# Patient Record
Sex: Female | Born: 1990 | Race: White | Hispanic: No | Marital: Married | Smoking: Never smoker
Health system: Southern US, Community
[De-identification: ages and names within clinical notes are randomized; demographics above are authoritative.]

## PROBLEM LIST (undated history)

## (undated) HISTORY — PX: NO PAST SURGERIES: SHX2092

---
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
## 2012-09-22 ENCOUNTER — Ambulatory Visit: Payer: Self-pay | Admitting: Emergency Medicine

## 2020-01-27 ENCOUNTER — Ambulatory Visit (INDEPENDENT_AMBULATORY_CARE_PROVIDER_SITE_OTHER)
Admit: 2020-01-27 | Discharge: 2020-01-27 | Disposition: A | Discharge status: Home / Self Care | Payer: BC Managed Care – PPO | Attending: Family Medicine | Admitting: Family Medicine

## 2020-01-27 ENCOUNTER — Ambulatory Visit
Admission: EM | Admit: 2020-01-27 | Discharge: 2020-01-27 | Disposition: A | Discharge status: Home / Self Care | Payer: BC Managed Care – PPO | Attending: Family Medicine | Admitting: Family Medicine

## 2020-01-27 ENCOUNTER — Encounter: Payer: Self-pay | Admitting: Emergency Medicine

## 2020-01-27 ENCOUNTER — Other Ambulatory Visit: Payer: Self-pay

## 2020-01-27 DIAGNOSIS — Z3A01 Less than 8 weeks gestation of pregnancy: Secondary | ICD-10-CM

## 2020-01-27 DIAGNOSIS — O4690 Antepartum hemorrhage, unspecified, unspecified trimester: Secondary | ICD-10-CM | POA: Diagnosis not present

## 2020-01-27 DIAGNOSIS — Z3491 Encounter for supervision of normal pregnancy, unspecified, first trimester: Secondary | ICD-10-CM | POA: Insufficient documentation

## 2020-01-27 LAB — HCG, QUANTITATIVE, PREGNANCY: hCG, Beta Chain, Quant, S: 4889 m[IU]/mL — ABNORMAL HIGH (ref <5)

## 2020-01-27 NOTE — ED Notes (Signed)
Outpatient appointment for ultrasound at [facility] scheduled for 4pm. Patient notified.

## 2020-01-27 NOTE — ED Provider Notes (Signed)
[facility]    CSN: [id number] Arrival date & time: 01/27/20  1134      History   Chief Complaint Chief Complaint  Patient presents with  . Vaginal Bleeding  . Pelvic Pain    HPI Twilla Khouri is a 29 y.o. female.   29 yo female with a c/o right pelvic pain and low back right sided pain and "vaginal spotting yesterday". States it was not a lot of blood, "more like spotting" and none today. States she recently found out she's pregnant and estimates that she's about 5 weeks. Denies any fevers, chills or urinary symptoms.    Vaginal Bleeding Pelvic Pain    History reviewed. No pertinent past medical history.  There are no problems to display for this patient.   Past Surgical History:  Procedure Laterality Date  . NO PAST SURGERIES      OB History    Gravida  1   Para      Term      Preterm      AB      Living        SAB      TAB      Ectopic      Multiple      Live Births               Home Medications    Prior to Admission medications   Not on File    Family History Family History  Problem Relation Age of Onset  . Healthy Mother   . Healthy Father     Social History Social History   Tobacco Use  . Smoking status: Never Smoker  . Smokeless tobacco: Never Used  Substance Use Topics  . Alcohol use: Not Currently  . Drug use: Not Currently     Allergies   Patient has no known allergies.   Review of Systems Review of Systems  Genitourinary: Positive for pelvic pain and vaginal bleeding.     Physical Exam Triage Vital Signs ED Triage Vitals  Enc Vitals Group     BP 01/27/20 1153 104/78     Pulse Rate 01/27/20 1153 76     Resp 01/27/20 1153 18     Temp 01/27/20 1153 98.4 F (36.9 C)     Temp Source 01/27/20 1153 Oral     SpO2 01/27/20 1153 100 %     Weight 01/27/20 1148 128 lb (58.1 kg)     Height 01/27/20 1148 5\' 6"  (1.676 m)     Head Circumference --      Peak Flow --      Pain Score  01/27/20 1148 4     Pain Loc --      Pain Edu? --      Excl. in GC? --    No data found.  Updated Vital Signs BP 104/78 (BP Location: Left Arm)   Pulse 76   Temp 98.4 F (36.9 C) (Oral)   Resp 18   Ht 5\' 6"  (1.676 m)   Wt 58.1 kg   LMP 12/22/2019 (Approximate)   SpO2 100%   BMI 20.66 kg/m   Visual Acuity Right Eye Distance:   Left Eye Distance:   Bilateral Distance:    Right Eye Near:   Left Eye Near:    Bilateral Near:     Physical Exam Vitals and nursing note reviewed.  Constitutional:      General: She is not in acute distress.  Appearance: She is not toxic-appearing or diaphoretic.  Abdominal:     General: There is no distension.     Palpations: Abdomen is soft. There is no mass.     Tenderness: There is abdominal tenderness (mild; right lower quadrant (pelvic); no rebound or guarding). There is no guarding or rebound.     Hernia: No hernia is present.  Neurological:     Mental Status: She is alert.      UC Treatments / Results  Labs (all labs ordered are listed, but only abnormal results are displayed) Labs Reviewed  HCG, QUANTITATIVE, PREGNANCY - Abnormal; Notable for the following components:      Result Value   hCG, Beta Chain, Quant, S 4,889 (*)    All other components within normal limits    EKG   Radiology US OB LESS THAN 14 WEEKS WITH OB TRANSVAGINAL  Result Date: 01/27/2020 CLINICAL DATA:  Initial evaluation for right lower quadrant tenderness with vaginal spotting. EXAM: OBSTETRIC <14 WK [place] AND TRANSVAGINAL OB US TECHNIQUE: Both transabdominal and transvaginal ultrasound examinations were performed for complete evaluation of the gestation as well as the maternal uterus, adnexal regions, and pelvic cul-de-sac. Transvaginal technique was performed to assess early pregnancy. COMPARISON:  None available. FINDINGS: Intrauterine gestational sac: Single. Yolk sac:  Not visualized. Embryo:  Not visualized. Cardiac Activity: Not visualized. Heart  Rate: N/A  bpm MSD: 6.9 mm   5 w   3 d Subchorionic hemorrhage:  None visualized. Maternal uterus/adnexae: Ovaries are normal in appearance bilaterally. Small degenerating corpus luteal cyst noted within the right ovary. Small volume free fluid within the pelvis. IMPRESSION: 1. Probable early intrauterine gestational sac, but no yolk sac, fetal pole, or cardiac activity yet visualized. Recommend follow-up quantitative B-HCG levels and follow-up US in 14 days to assess viability. This recommendation follows SRU consensus guidelines: Diagnostic Criteria for Nonviable Pregnancy Early in the First Trimester. [employer] Med 2013; 740:8144-81. 2. Degenerating right ovarian corpus luteal cyst with associated small volume free fluid within the pelvis. Finding could contribute to right lower quadrant pain. Electronically Signed   By: Jeannine Boga M.D.   On: 01/27/2020 16:50    Procedures Procedures (including critical care time)  Medications Ordered in UC Medications - No data to display  Initial Impression / Assessment and Plan / UC Course  I have reviewed the triage vital signs and the nursing notes.  Pertinent labs & imaging results that were available during my care of the patient were reviewed by me and considered in my medical decision making (see chart for details).      Final Clinical Impressions(s) / UC Diagnoses   Final diagnoses:  Vaginal bleeding in pregnancy  Pregnancy with fewer than 5 completed weeks gestation     Discharge Instructions     Await OB US this afternoon for further recommendations    ED Prescriptions    None      1. Lab and OB US results and diagnosis reviewed with patient (this evening) and recommendations below given: 2. Recommend checking serial serum HCG blood test to monitor hormonal  increase and repeat US; patient states she's establishing care with an OB and will call them in the morning 3. Discussed with patient if further bleeding, recommend  being seen ASAP, in ED  4. Follow-up prn   PDMP not reviewed this encounter.   Norval Gable, MD 01/27/20 2046

## 2020-01-27 NOTE — ED Triage Notes (Signed)
Pt states that she found out about a week ago that she was pregnant. She has been having pain in her right pelvic area and lower back on her right side. She also had some vaginal bleed. Not very heavy bleeding. Blood was brown and pink in color, some clotting. She has not had bleeding today, but has had the pain.  Pt does not have a OB she had home pregnancy test that determined her pregnancy.

## 2020-01-27 NOTE — Discharge Instructions (Addendum)
Await OB US this afternoon for further recommendations

## 2021-11-11 IMAGING — US US OB < 14 WEEKS - US OB TV
1 series · 13 of 28 positions shown · non-contrast
Comparison: None available.

CLINICAL DATA: Initial evaluation for right lower quadrant
tenderness with vaginal spotting.

EXAM:
OBSTETRIC <14 WK US AND TRANSVAGINAL OB US
TECHNIQUE: Both transabdominal and transvaginal ultrasound examinations were
performed for complete evaluation of the gestation as well as the
maternal uterus, adnexal regions, and pelvic cul-de-sac.
Transvaginal technique was performed to assess early pregnancy.

[Series 1: us ob < 14 weeks - us ob tv · 0.20mm/px · 13 of 134 slices shown]
[im 5/134]
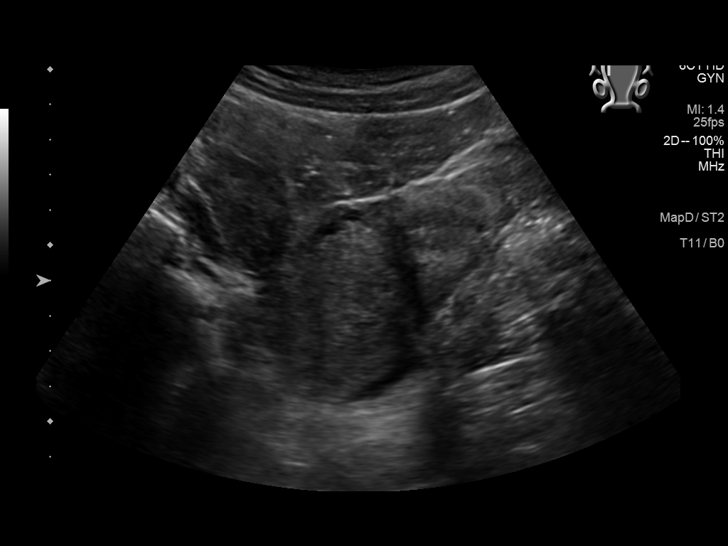
[im 15/134]
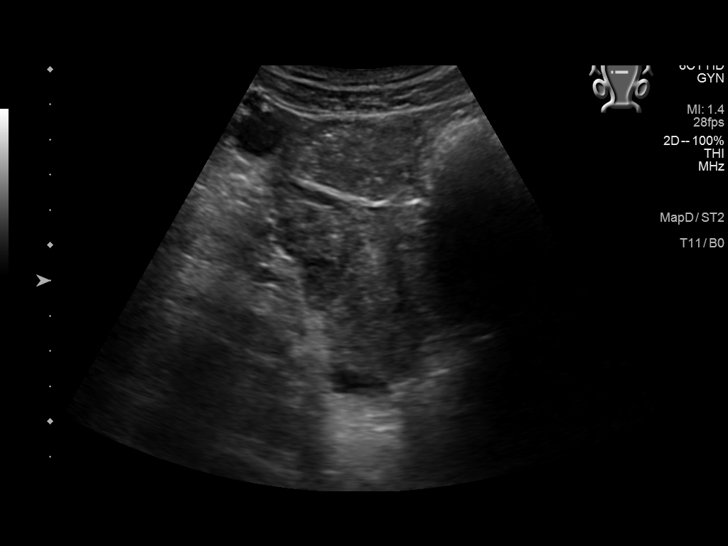
[im 25/134]
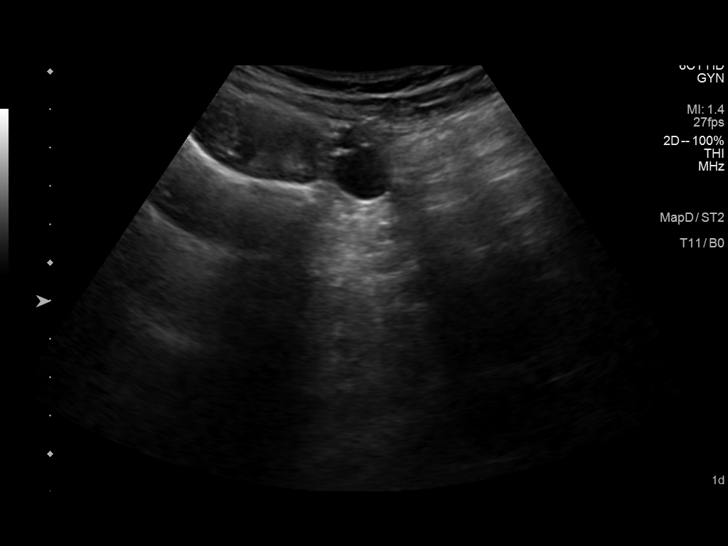
[im 35/134]
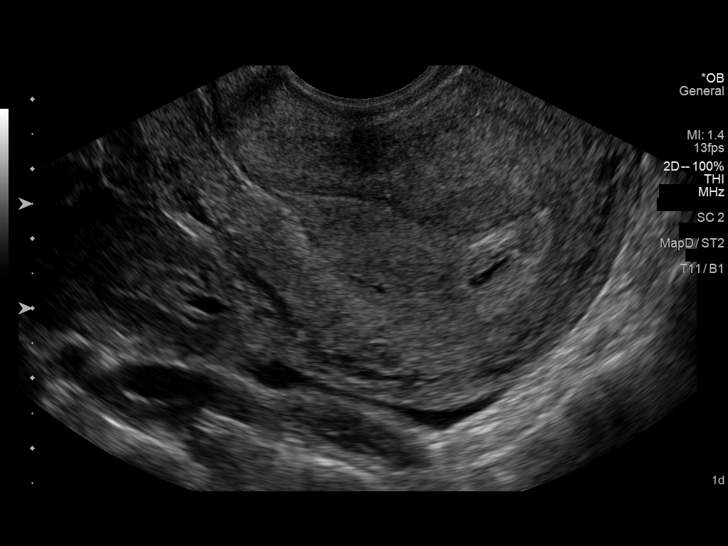
[im 45/134]
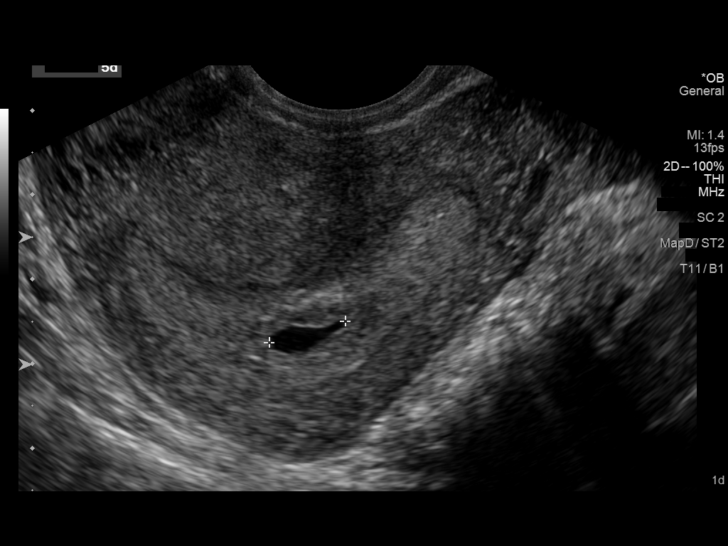
[im 55/134]
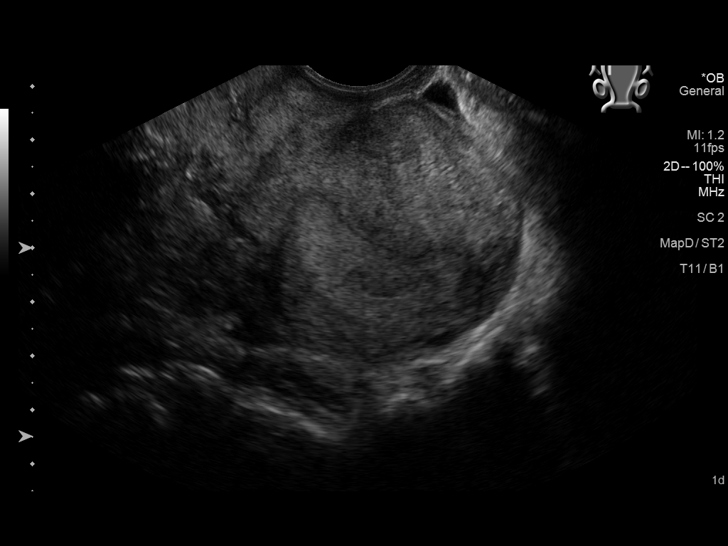
[im 69/134]
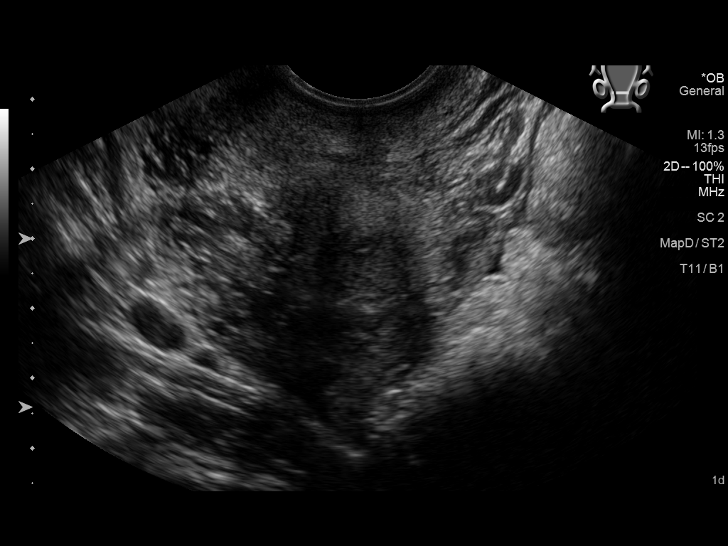
[im 79/134]
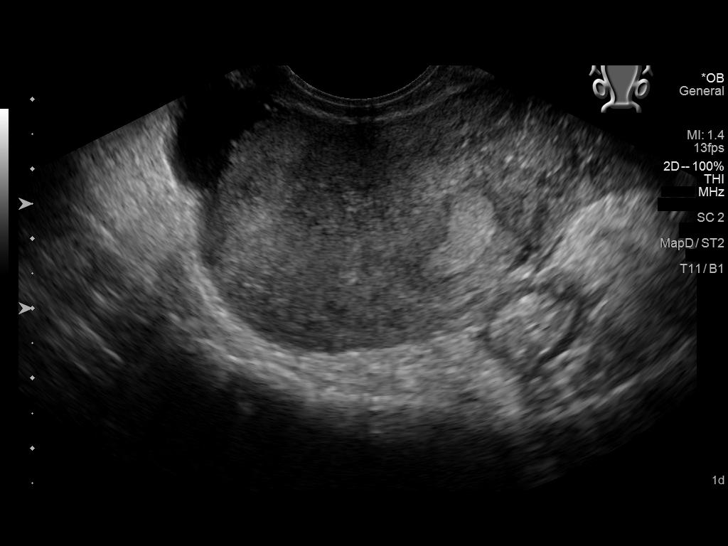
[im 89/134]
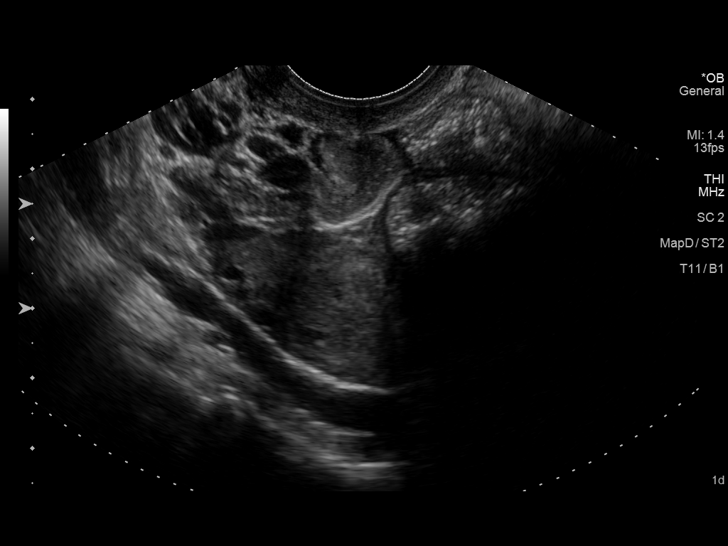
[im 99/134]
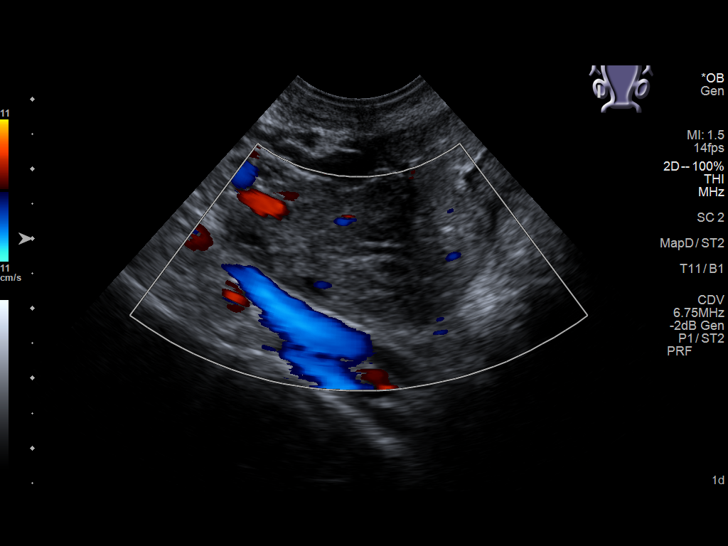
[im 109/134]
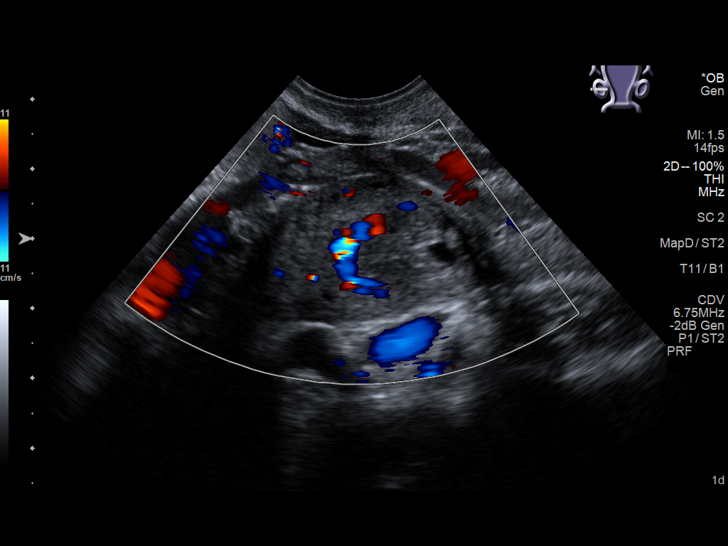
[im 119/134]
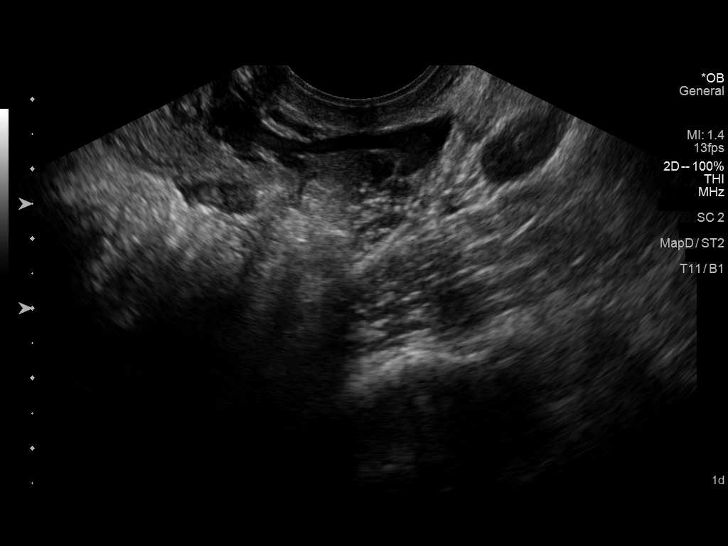
[im 129/134]
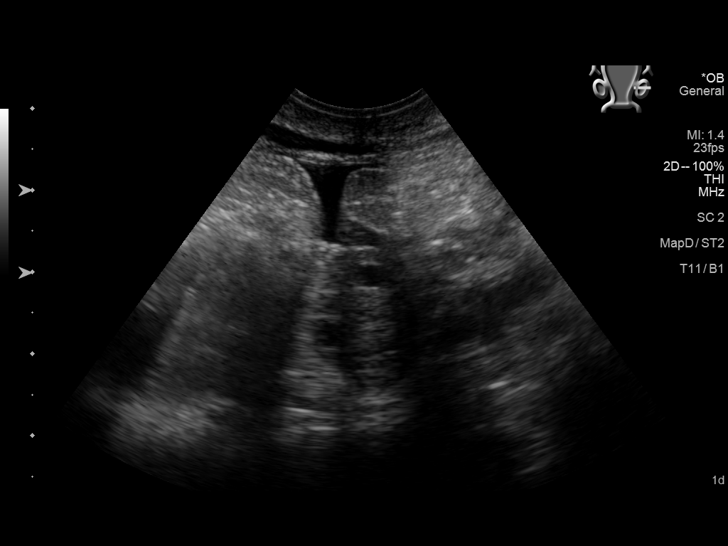

[13 of 28 positions shown; findings below may reference images not displayed]

FINDINGS: Intrauterine gestational sac: Single.

Yolk sac:  Not visualized.

Embryo:  Not visualized.

Cardiac Activity: Not visualized.

Heart Rate: N/A  bpm

MSD: 6.9 mm   5 w   3 d

Subchorionic hemorrhage:  None visualized.

Maternal uterus/adnexae: Ovaries are normal in appearance
bilaterally. Small degenerating corpus luteal cyst noted within the
right ovary. Small volume free fluid within the pelvis.
IMPRESSION: 1. Probable early intrauterine gestational sac, but no yolk sac,
fetal pole, or cardiac activity yet visualized. Recommend follow-up
quantitative B-HCG levels and follow-up US in 14 days to assess
viability. This recommendation follows SRU consensus guidelines:
Diagnostic Criteria for Nonviable Pregnancy Early in the First
Trimester. N Engl J Med 5337; [DATE].
2. Degenerating right ovarian corpus luteal cyst with associated
small volume free fluid within the pelvis. Finding could contribute
to right lower quadrant pain.
